# Patient Record
Sex: Male | Born: 1995 | Race: White | Hispanic: No | Marital: Single | Smoking: Current every day smoker
Health system: Southern US, Community
[De-identification: ages and names within clinical notes are randomized; demographics above are authoritative.]

---

## 2015-01-06 ENCOUNTER — Emergency Department
Admission: EM | Admit: 2015-01-06 | Discharge: 2015-01-06 | Disposition: A | Discharge status: Home / Self Care | Payer: Self-pay | Attending: Emergency Medicine | Admitting: Emergency Medicine

## 2015-01-06 ENCOUNTER — Emergency Department: Payer: Self-pay

## 2015-01-06 ENCOUNTER — Encounter: Payer: Self-pay | Admitting: Emergency Medicine

## 2015-01-06 DIAGNOSIS — J209 Acute bronchitis, unspecified: Secondary | ICD-10-CM | POA: Insufficient documentation

## 2015-01-06 DIAGNOSIS — F17210 Nicotine dependence, cigarettes, uncomplicated: Secondary | ICD-10-CM | POA: Insufficient documentation

## 2015-01-06 MED ORDER — AZITHROMYCIN 250 MG PO TABS: 500.0000 mg | ORAL_TABLET | Freq: Every day | ORAL | Status: AC

## 2015-01-06 MED ORDER — AZITHROMYCIN 250 MG PO TABS
500.0000 mg | ORAL_TABLET | Freq: Once | ORAL | Status: AC
  Administered 2015-01-06: 500 mg via ORAL
  Filled 2015-01-06: qty 2

## 2015-01-06 MED ORDER — BENZONATATE 100 MG PO CAPS
200.0000 mg | ORAL_CAPSULE | Freq: Once | ORAL | Status: AC
  Administered 2015-01-06: 200 mg via ORAL
  Filled 2015-01-06: qty 2

## 2015-01-06 MED ORDER — ALBUTEROL SULFATE HFA 108 (90 BASE) MCG/ACT IN AERS: 2.0000 | INHALATION_SPRAY | Freq: Four times a day (QID) | RESPIRATORY_TRACT | Status: DC | PRN

## 2015-01-06 MED ORDER — BENZONATATE 200 MG PO CAPS: 100.0000 mg | ORAL_CAPSULE | Freq: Three times a day (TID) | ORAL | Status: DC | PRN

## 2015-01-06 NOTE — Discharge Instructions (Signed)

## 2015-01-06 NOTE — ED Provider Notes (Signed)
[facility] Emergency Department Provider Note  ____________________________________________  Time seen: 2:30 AM  I have reviewed the triage vital signs and the nursing notes.   HISTORY  Chief Complaint Nasal Congestion     HPI Willie Valdez is a 19 y.o. male presents with nonproductive cough, chills and nasal congestion 3 days. Patient admits to cigarette use approximately half pack per day.     History reviewed. No pertinent past medical history.  There are no active problems to display for this patient.   History reviewed. No pertinent past surgical history.  Current Outpatient Rx  Name  Route  Sig  Dispense  Refill  . albuterol (PROVENTIL HFA;VENTOLIN HFA) 108 (90 BASE) MCG/ACT inhaler   Inhalation   Inhale 2 puffs into the lungs every 6 (six) hours as needed for wheezing or shortness of breath.   1 Inhaler   2   . azithromycin (ZITHROMAX) 250 MG tablet   Oral   Take 2 tablets (500 mg total) by mouth daily.   6 each   0   . benzonatate (TESSALON) 200 MG capsule   Oral   Take 1 capsule (200 mg total) by mouth 3 (three) times daily as needed for cough.   20 capsule   0     Allergies No known drug allergies No family history on file.  Social History Social History  Substance Use Topics  . Smoking status: Current Every Day Smoker -- 0.50 packs/day    Types: Cigarettes  . Smokeless tobacco: None  . Alcohol Use: No    Review of Systems  Constitutional: Negative for fever. Positive for chills Eyes: Negative for visual changes. ENT: Negative for sore throat. Cardiovascular: Negative for chest pain. Respiratory: Positive for cough Gastrointestinal: Negative for abdominal pain, vomiting and diarrhea. Genitourinary: Negative for dysuria. Musculoskeletal: Negative for back pain. Skin: Negative for rash. Neurological: Negative for headaches, focal weakness or numbness.   10-point ROS otherwise  negative.  ____________________________________________   PHYSICAL EXAM:  VITAL SIGNS: ED Triage Vitals  Enc Vitals Group     BP 01/06/15 0150 123/67 mmHg     Pulse Rate 01/06/15 0150 85     Resp 01/06/15 0150 18     Temp 01/06/15 0150 98.6 F (37 C)     Temp Source 01/06/15 0150 Oral     SpO2 01/06/15 0150 98 %     Weight 01/06/15 0150 135 lb (61.236 kg)     Height 01/06/15 0150 5\' 8"  (1.727 m)     Head Cir --      Peak Flow --      Pain Score --      Pain Loc --      Pain Edu? --      Excl. in GC? --      Constitutional: Alert and oriented. Well appearing and in no distress. Eyes: Conjunctivae are normal. PERRL. Normal extraocular movements. ENT   Head: Normocephalic and atraumatic.   Nose: No congestion/rhinnorhea.   Mouth/Throat: Mucous membranes are moist.   Neck: No stridor. Hematological/Lymphatic/Immunilogical: No cervical lymphadenopathy. Cardiovascular: Normal rate, regular rhythm. Normal and symmetric distal pulses are present in all extremities. No murmurs, rubs, or gallops. Respiratory: Normal respiratory effort without tachypnea nor retractions. Breath sounds are clear and equal bilaterally. Mild bibasilar rhonchi Gastrointestinal: Soft and nontender. No distention. There is no CVA tenderness. Genitourinary: deferred Musculoskeletal: Nontender with normal range of motion in all extremities. No joint effusions.  No lower extremity tenderness nor edema. Neurologic:  Normal speech and language. No gross focal neurologic deficits are appreciated. Speech is normal.  Skin:  Skin is warm, dry and intact. No rash noted. Psychiatric: Mood and affect are normal. Speech and behavior are normal. Patient exhibits appropriate insight and judgment.   RADIOLOGY  DG Chest 2 View (Final result) Result time: 01/06/15 03:20:30   Final result by Rad Results In Interface (01/06/15 03:20:30)   Narrative:   CLINICAL DATA: Cough, congestion, and chills since  Friday. Smoker.  EXAM: CHEST 2 VIEW  COMPARISON: None.  FINDINGS: Hyperinflation. The heart size and mediastinal contours are within normal limits. Both lungs are clear. The visualized skeletal structures are unremarkable.  IMPRESSION: No active cardiopulmonary disease.   Electronically Signed By: Burman Nieves M.D. On: 01/06/2015 03:20        INITIAL IMPRESSION / ASSESSMENT AND PLAN / ED COURSE  Pertinent labs & imaging results that were available during my care of the patient were reviewed by me and considered in my medical decision making (see chart for details).    ____________________________________________   FINAL CLINICAL IMPRESSION(S) / ED DIAGNOSES  Final diagnoses:  Acute bronchitis, unspecified organism      Darci Current, MD 01/06/15 [phone]

## 2015-01-06 NOTE — ED Notes (Signed)
Patient ambulatory to triage with steady gait, without difficulty or distress noted; pt reports sinus congestion since Friday

## 2015-01-06 NOTE — ED Notes (Signed)
Pt walked to XR.

## 2015-01-06 NOTE — ED Notes (Signed)
Pt walked from XR.

## 2015-09-27 ENCOUNTER — Encounter: Payer: Self-pay | Admitting: Emergency Medicine

## 2015-09-27 DIAGNOSIS — Y939 Activity, unspecified: Secondary | ICD-10-CM | POA: Insufficient documentation

## 2015-09-27 DIAGNOSIS — Y999 Unspecified external cause status: Secondary | ICD-10-CM | POA: Insufficient documentation

## 2015-09-27 DIAGNOSIS — W293XXA Contact with powered garden and outdoor hand tools and machinery, initial encounter: Secondary | ICD-10-CM | POA: Insufficient documentation

## 2015-09-27 DIAGNOSIS — S81811A Laceration without foreign body, right lower leg, initial encounter: Secondary | ICD-10-CM | POA: Insufficient documentation

## 2015-09-27 DIAGNOSIS — Y929 Unspecified place or not applicable: Secondary | ICD-10-CM | POA: Insufficient documentation

## 2015-09-27 DIAGNOSIS — F1290 Cannabis use, unspecified, uncomplicated: Secondary | ICD-10-CM | POA: Insufficient documentation

## 2015-09-27 DIAGNOSIS — Z23 Encounter for immunization: Secondary | ICD-10-CM | POA: Insufficient documentation

## 2015-09-27 DIAGNOSIS — F17290 Nicotine dependence, other tobacco product, uncomplicated: Secondary | ICD-10-CM | POA: Insufficient documentation

## 2015-09-27 NOTE — ED Triage Notes (Signed)
Pt says around 6pm tonight he was helping a friend cut down some trees; presents with several laceration to right lower leg from chainsaw; pt wrapped the area, no bleeding through gauze noted;

## 2015-09-28 ENCOUNTER — Telehealth: Payer: Self-pay | Admitting: Emergency Medicine

## 2015-09-28 ENCOUNTER — Emergency Department
Admission: EM | Admit: 2015-09-28 | Discharge: 2015-09-28 | Disposition: A | Discharge status: Home / Self Care | Payer: Self-pay | Attending: Emergency Medicine | Admitting: Emergency Medicine

## 2015-09-28 ENCOUNTER — Emergency Department: Payer: Self-pay

## 2015-09-28 DIAGNOSIS — S8991XA Unspecified injury of right lower leg, initial encounter: Secondary | ICD-10-CM

## 2015-09-28 DIAGNOSIS — IMO0002 Reserved for concepts with insufficient information to code with codable children: Secondary | ICD-10-CM

## 2015-09-28 MED ORDER — TETANUS-DIPHTH-ACELL PERTUSSIS 5-2.5-18.5 LF-MCG/0.5 IM SUSP
0.5000 mL | Freq: Once | INTRAMUSCULAR | Status: AC
  Administered 2015-09-28: 0.5 mL via INTRAMUSCULAR
  Filled 2015-09-28: qty 0.5

## 2015-09-28 MED ORDER — LIDOCAINE-EPINEPHRINE-TETRACAINE (LET) SOLUTION
3.0000 mL | Freq: Once | NASAL | Status: AC
  Administered 2015-09-28: 3 mL via TOPICAL
  Filled 2015-09-28: qty 3

## 2015-09-28 MED ORDER — TRAMADOL HCL 50 MG PO TABS
50.0000 mg | ORAL_TABLET | Freq: Once | ORAL | Status: AC
  Administered 2015-09-28: 50 mg via ORAL
  Filled 2015-09-28: qty 1

## 2015-09-28 MED ORDER — LIDOCAINE HCL (PF) 1 % IJ SOLN
5.0000 mL | Freq: Once | INTRAMUSCULAR | Status: DC
  Filled 2015-09-28: qty 5

## 2015-09-28 MED ORDER — TRAMADOL HCL 50 MG PO TABS: 50.0000 mg | ORAL_TABLET | Freq: Four times a day (QID) | ORAL | 0 refills | Status: DC | PRN

## 2015-09-28 NOTE — ED Notes (Signed)
Pt reports around 6 pm he was helping a friend when he noticed bleeding to his right lower leg. Pt reports he thought he had cut it on a tree branch but his friend told him he had hit it with the chainsaw. 2 small lacerations noted to right lower leg, medial side with bleeding controlled. One approx 3 to 3.5 cm and the other approx. 2.5 cm.

## 2015-09-28 NOTE — ED Provider Notes (Signed)
[facility] Emergency Department Provider Note   ____________________________________________   First MD Initiated Contact with Patient 09/28/15 0206     (approximate)  I have reviewed the triage vital signs and the nursing notes.   HISTORY  Chief Complaint Laceration    HPI Willie Valdez is a 20 y.o. male who comes into the hospital today with a cut on his leg. The patient reports that he cut his leg accidentally today with a chainsaw. He reports it occurred around 650 this afternoon. He put some peroxide on it and wrapped it and decided to come in. The patient is unsure of his last tetanus shot. He reports that it hurts and although he is able to walk it's very uncomfortable. He reports that he took some Aleve hours ago but he still uncomfortable. The patient rates his pain a 10 out of 10 in intensity. He reports that he does not think his leg is broken.He is here for repair.   History reviewed. No pertinent past medical history.  There are no active problems to display for this patient.   History reviewed. No pertinent surgical history.  Prior to Admission medications   Medication Sig Start Date End Date Taking? Authorizing Provider  albuterol (PROVENTIL HFA;VENTOLIN HFA) 108 (90 BASE) MCG/ACT inhaler Inhale 2 puffs into the lungs every 6 (six) hours as needed for wheezing or shortness of breath. 01/06/15   Darci Currentandolph N Brown, MD  benzonatate (TESSALON) 200 MG capsule Take 1 capsule (200 mg total) by mouth 3 (three) times daily as needed for cough. 01/06/15   Darci Currentandolph N Brown, MD    Allergies Review of patient's allergies indicates no known allergies.  History reviewed. No pertinent family history.  Social History Social History  Substance Use Topics  . Smoking status: Current Every Day Smoker    Types: E-cigarettes  . Smokeless tobacco: Never Used  . Alcohol use No    Review of Systems Constitutional: No fever/chills Eyes: No visual  changes. ENT: No sore throat. Cardiovascular: Denies chest pain. Respiratory: Denies shortness of breath. Gastrointestinal: No abdominal pain.  No nausea, no vomiting.  No diarrhea.  No constipation. Genitourinary: Negative for dysuria. Musculoskeletal: Negative for back pain. Skin: Right leg laceration Neurological: Negative for headaches, focal weakness or numbness.  10-point ROS otherwise negative.  ____________________________________________   PHYSICAL EXAM:  VITAL SIGNS: ED Triage Vitals  Enc Vitals Group     BP 09/27/15 2334 136/71     Pulse Rate 09/27/15 2334 81     Resp 09/27/15 2334 18     Temp 09/27/15 2334 98.4 F (36.9 C)     Temp Source 09/27/15 2334 Oral     SpO2 09/27/15 2334 100 %     Weight 09/27/15 2335 130 lb (59 kg)     Height 09/27/15 2335 5\' 7"  (1.702 m)     Head Circumference --      Peak Flow --      Pain Score 09/27/15 2335 10     Pain Loc --      Pain Edu? --      Excl. in GC? --     Constitutional: Alert and oriented. Well appearing and in Mild distress. Eyes: Conjunctivae are normal. PERRL. EOMI. Head: Atraumatic. Nose: No congestion/rhinnorhea. Mouth/Throat: Mucous membranes are moist.  Oropharynx non-erythematous. Cardiovascular: Normal rate, regular rhythm. Grossly normal heart sounds.  Good peripheral circulation. Respiratory: Normal respiratory effort.  No retractions. Lungs CTAB. Gastrointestinal: Soft and nontender. No distention. Positive bowel  sounds Musculoskeletal: No lower extremity tenderness nor edema.   Neurologic:  Normal speech and language.  Skin:  Skin is warm, dry with a laceration to his right medial lower leg approximately 2 inches followed by a more shallow 3 inch area and another deeper one and a half inch area.Marland Kitchen  Psychiatric: Mood and affect are normal. Speech and behavior are normal.  ____________________________________________   LABS (all labs ordered are listed, but only abnormal results are  displayed)  Labs Reviewed - No data to display ____________________________________________  EKG  none ____________________________________________  RADIOLOGY  Dg tib/fib right ____________________________________________   PROCEDURES  Procedure(s) performed: None  .Marland KitchenLaceration Repair Date/Time: 09/28/2015 4:14 AM Performed by: Rebecka Apley Authorized by: Rebecka Apley   Consent:    Consent obtained:  Verbal   Consent given by:  Patient   Risks discussed:  Infection, pain and poor wound healing Anesthesia (see MAR for exact dosages):    Anesthesia method:  Local infiltration and topical application   Topical anesthetic:  LET   Local anesthetic:  Lidocaine 1% w/o epi Laceration details:    Location:  Leg   Leg location:  R lower leg   Length (cm):  8 Repair type:    Repair type:  Simple Pre-procedure details:    Preparation:  Patient was prepped and draped in usual sterile fashion and imaging obtained to evaluate for foreign bodies Exploration:    Contaminated: no   Treatment:    Area cleansed with:  Saline and Shur-Clens   Amount of cleaning:  Standard   Irrigation solution:  Sterile saline   Irrigation method:  Syringe   Visualized foreign bodies/material removed: no   Skin repair:    Repair method:  Sutures   Suture size:  4-0   Suture material:  Nylon   Suture technique:  Simple interrupted   Number of sutures:  8 Approximation:    Approximation:  Close   Vermilion border: well-aligned   Post-procedure details:    Dressing:  Antibiotic ointment   Patient tolerance of procedure:  Tolerated well, no immediate complications    Critical Care performed: No  ____________________________________________   INITIAL IMPRESSION / ASSESSMENT AND PLAN / ED COURSE  Pertinent labs & imaging results that were available during my care of the patient were reviewed by me and considered in my medical decision making (see chart for details).  This is  a 20 year old male who comes into the hospital today with a laceration to his legs in a chainsaw. The patient will receive a tetanus shot. I will then put some let on the wound and send the patient for an x-ray. I will then suture the patient's wound.  Clinical Course  Value Comment By Time  DG Tibia/Fibula Right Negative Rebecka Apley, MD 08/14 0410   The patient's wound was sutured without any difficulty. He will have some bacitracin ointment placed to it and he will receive 15 dad. The patient will then be discharged home. He should have his sutures removed in 10 days. Rebecka Apley, MD 08/14 [phone]    The patient will be discharged to home. ____________________________________________   FINAL CLINICAL IMPRESSION(S) / ED DIAGNOSES  Final diagnoses:  Laceration  Leg injury, right, initial encounter      NEW MEDICATIONS STARTED DURING THIS VISIT:  New Prescriptions   No medications on file     Note:  This document was prepared using Dragon voice recognition software and may include unintentional dictation errors.  Rebecka ApleyAllison P Kaleb Linquist, MD 09/28/15 [phone]

## 2015-09-28 NOTE — Telephone Encounter (Signed)
Attempted to call patient to go over discharge instructions and inform that he can come back to pick them up--as well as work note and rx.  Pt did not answer and no voicemail set up.  Will place papers at stat desk in case pt comes back to pick them up.

## 2015-09-28 NOTE — ED Notes (Signed)
LET placed on laceration and taped into place. Suture cart to bedside per MD request. Patient resting quietly awaiting suture repair. Will continue to monitor.

## 2015-09-28 NOTE — ED Notes (Signed)
To room to discharge patient and patient not in room. Pt seen leaving by registration clerk.

## 2016-02-11 ENCOUNTER — Other Ambulatory Visit: Payer: Self-pay | Admitting: Family Medicine

## 2016-02-11 ENCOUNTER — Ambulatory Visit
Admission: RE | Admit: 2016-02-11 | Discharge: 2016-02-11 | Disposition: A | Discharge status: Home / Self Care | Payer: Medicaid Other | Source: Ambulatory Visit | Attending: Family Medicine | Admitting: Family Medicine

## 2016-02-11 DIAGNOSIS — M545 Low back pain: Secondary | ICD-10-CM

## 2017-11-27 ENCOUNTER — Encounter (HOSPITAL_COMMUNITY): Payer: Self-pay | Admitting: Behavioral Health

## 2017-11-27 ENCOUNTER — Inpatient Hospital Stay (HOSPITAL_COMMUNITY)
Admission: AD | Admit: 2017-11-27 | Discharge: 2017-11-30 | DRG: 885 | Disposition: A | Discharge status: Home / Self Care | Payer: Federal, State, Local not specified - Other | Source: Intra-hospital | Attending: Psychiatry | Admitting: Psychiatry

## 2017-11-27 ENCOUNTER — Other Ambulatory Visit: Payer: Self-pay

## 2017-11-27 DIAGNOSIS — F17290 Nicotine dependence, other tobacco product, uncomplicated: Secondary | ICD-10-CM | POA: Diagnosis present

## 2017-11-27 DIAGNOSIS — T1491XA Suicide attempt, initial encounter: Secondary | ICD-10-CM | POA: Diagnosis not present

## 2017-11-27 DIAGNOSIS — F332 Major depressive disorder, recurrent severe without psychotic features: Secondary | ICD-10-CM | POA: Diagnosis present

## 2017-11-27 DIAGNOSIS — T391X2A Poisoning by 4-Aminophenol derivatives, intentional self-harm, initial encounter: Secondary | ICD-10-CM | POA: Diagnosis not present

## 2017-11-27 DIAGNOSIS — Z818 Family history of other mental and behavioral disorders: Secondary | ICD-10-CM | POA: Diagnosis not present

## 2017-11-27 DIAGNOSIS — T391X2D Poisoning by 4-Aminophenol derivatives, intentional self-harm, subsequent encounter: Secondary | ICD-10-CM

## 2017-11-27 MED ORDER — TRAZODONE HCL 50 MG PO TABS
50.0000 mg | ORAL_TABLET | Freq: Every evening | ORAL | Status: DC | PRN
  Administered 2017-11-27 – 2017-11-29 (×3): 50 mg via ORAL
  Filled 2017-11-27: qty 7
  Filled 2017-11-27 (×3): qty 1

## 2017-11-27 MED ORDER — ALUM & MAG HYDROXIDE-SIMETH 200-200-20 MG/5ML PO SUSP: 30.0000 mL | ORAL | Status: DC | PRN

## 2017-11-27 MED ORDER — NICOTINE 21 MG/24HR TD PT24
21.0000 mg | MEDICATED_PATCH | Freq: Every day | TRANSDERMAL | Status: DC
  Administered 2017-11-27 – 2017-11-29 (×3): 21 mg via TRANSDERMAL
  Filled 2017-11-27 (×6): qty 1

## 2017-11-27 MED ORDER — HYDROXYZINE HCL 25 MG PO TABS
25.0000 mg | ORAL_TABLET | Freq: Three times a day (TID) | ORAL | Status: DC | PRN
  Administered 2017-11-27 – 2017-11-29 (×4): 25 mg via ORAL
  Filled 2017-11-27: qty 1
  Filled 2017-11-27: qty 10
  Filled 2017-11-27 (×3): qty 1

## 2017-11-27 MED ORDER — MAGNESIUM HYDROXIDE 400 MG/5ML PO SUSP: 30.0000 mL | Freq: Every day | ORAL | Status: DC | PRN

## 2017-11-27 MED ORDER — NICOTINE POLACRILEX 2 MG MT GUM
2.0000 mg | CHEWING_GUM | OROMUCOSAL | Status: DC | PRN
  Administered 2017-11-27: 2 mg via ORAL
  Filled 2017-11-27: qty 1
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
## 2017-11-27 NOTE — Progress Notes (Addendum)
Patient has 3 small girls, ages 41, 67, 24 yrs old that he misses very much.  Patient stated that his employer does not like him to miss any days.  Patient will need a letter for his employer when he is discharged.
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
## 2017-11-27 NOTE — BHH Group Notes (Signed)
Adult Psychoeducational Group Note  Date:  11/27/2017 Time:  9:40 PM  Group Topic/Focus:  Wrap-Up Group:   The focus of this group is to help patients review their daily goal of treatment and discuss progress on daily workbooks.  Participation Level:  Active  Participation Quality:  Appropriate and Attentive  Affect:  Appropriate  Cognitive:  Alert and Appropriate  Insight: Appropriate and Good  Engagement in Group:  Engaged  Modes of Intervention:  Discussion and Education  Additional Comments:  Pt attended and participated in wrap up group this evening. Pt had a good day, even though they are still getting adjusted to being here at [facility]. Pt is still still of a goal to work on while they are here.   Chrisandra Netters 11/27/2017, 9:40 PM

## 2017-11-28 DIAGNOSIS — T391X2A Poisoning by 4-Aminophenol derivatives, intentional self-harm, initial encounter: Secondary | ICD-10-CM

## 2017-11-28 DIAGNOSIS — T1491XA Suicide attempt, initial encounter: Secondary | ICD-10-CM

## 2017-11-28 NOTE — BHH Group Notes (Signed)
LCSW Group Therapy Note 11/28/2017 2:29 PM  Type of Therapy/Topic: Group Therapy: Feelings about Diagnosis  Participation Level: Active   Description of Group:  This group will allow patients to explore their thoughts and feelings about diagnoses they have received. Patients will be guided to explore their level of understanding and acceptance of these diagnoses. Facilitator will encourage patients to process their thoughts and feelings about the reactions of others to their diagnosis and will guide patients in identifying ways to discuss their diagnosis with significant others in their lives. This group will be process-oriented, with patients participating in exploration of their own experiences, giving and receiving support, and processing challenge from other group members.  Therapeutic Goals: 1. Patient will demonstrate understanding of diagnosis as evidenced by identifying two or more symptoms of the disorder 2. Patient will be able to express two feelings regarding the diagnosis 3. Patient will demonstrate their ability to communicate their needs through discussion and/or role play  Summary of Patient Progress:  Willie Valdez was engaged and participated throughout the group session. Willie Valdez reports that having a mental illness would not change who he is. Willie Valdez states that living with mental health issues is a working progress and that you will be fine if you manage it appropriately.     Therapeutic Modalities:  Cognitive Behavioral Therapy Brief Therapy Feelings Identification    Willie Valdez Willie Valdez Clinical Social Worker

## 2017-11-28 NOTE — H&P (Addendum)
Psychiatric Admission Assessment Adult  Patient Identification: Willie Valdez MRN:  [id number] Date of Evaluation:  11/28/2017 Chief Complaint:  " I was not trying to kill myself " Principal Diagnosis:  S/P Overdose , Undetermined Intent Diagnosis:   Patient Active Problem List   Diagnosis Date Noted  . 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 States " I think I took about 8 pills". (.Of note, ED note indicates that there were 84 tablets remaining in a 150 count bottle) .States he vomited" the pills up almost right away".  He reports overdose was unplanned, " spur of the moment", and in the context of conflict with GF. States " I guess I was just trying to get a rise out of her".  Patient denies recent depression, and states" I was feeling fine", states " it was just an impulsive type deal".  Associated Signs/Symptoms: Depression Symptoms:   Denies neuro-vegetative symptoms, denies anhedonia, denies sadness, denies any changes in sleep, appetite, energy level, denies suicidal ideations (Hypo) Manic Symptoms: denies  Anxiety Symptoms: denies increased anxiety symptoms prior to admission Psychotic Symptoms:  denies  PTSD Symptoms: Denies  Total Time spent with patient: 45 minutes  Past Psychiatric History: no prior psychiatric admissions . Denies prior history of suicidal attempts, denies history of self injurious behaviors or of self cutting, no history of psychosis, denies history of depression, denies history of mania, denies history of PTSD, denies history of panic or agoraphobia. Denies history of violence   Is the patient at risk to self? Yes.    Has the patient been a risk to self in the past 6 months? No.  Has the patient been a risk to self within the distant past? No.  Is the patient a risk to others? No.   Has the patient been a risk to others in the past 6 months? No.  Has the patient been a risk to others within the distant past? No.   Prior Inpatient Therapy: Prior Inpatient Therapy: (unknown) Prior Outpatient Therapy: Prior Outpatient Therapy: (unknown)  Alcohol Screening: 1. How often do you have a drink containing alcohol?: Monthly or less 2. How many drinks containing alcohol do you have on a typical day when you are drinking?: 1 or 2 3. How often do you have six or more drinks on one occasion?: Never AUDIT-C Score: 1 4. How often during the last year have you found that you were not able to stop drinking once you had started?: Never 5. How often during the last year have you failed to do what was normally expected from you becasue of drinking?: Never 6. How often during the last year have you needed a first drink in the morning to get yourself going after a heavy drinking session?: Never 7. How often during the last year have you had a feeling of guilt of remorse after drinking?: Never 8. How often during the last year have you been unable to remember what happened the night before because you had been drinking?: Never 9. Have you or someone else been injured as a result of your drinking?: No 10. Has a relative or friend or a doctor or another health worker been concerned about your drinking or suggested you cut down?: No Alcohol Use Disorder Identification Test Final Score (AUDIT): 1 Intervention/Follow-up: AUDIT Score <7 follow-up not indicated Substance Abuse History  in the last 12 months:  Denies alcohol or drug abuse  Consequences of Substance Abuse: Denies  Previous Psychotropic Medications: states he was not taking any psychiatric medications prior to admission/has never been on psychiatric medications. Psychological Evaluations:  No  Past Medical History: Denies medical illnesses . NKDA. Family History: parents alive, separated, has two sisters Family Psychiatric  History:  paternal aunt has schizophrenia, no suicides in family. Denies history of substance abuse in family . Tobacco Screening: smokes 1 PPD  Social History: 66, married, has three children ( 6,4,2), who are currently with their mother. Lives with GF. Employed . Denies legal issues . Social History   Substance and Sexual Activity  Alcohol Use No     Social History   Substance and Sexual Activity  Drug Use Yes  . Types: Marijuana   Comment: last smoked 2 weeks ago    Additional Social History: Marital status: Long term relationship Long term relationship, how long?: 4 years  What types of issues is patient dealing with in the relationship?: Financial stress Additional relationship information: No Are you sexually active?: Yes What is your sexual orientation?: Heterosexual  Has your sexual activity been affected by drugs, alcohol, medication, or emotional stress?: No Does patient have children?: Yes How many children?: 3 How is patient's relationship with their children?: Patient reports being very close to his three daughters     Pain Medications: see MAR Prescriptions: see MAR Over the Counter: see MAR History of alcohol / drug use?: Yes Longest period of sobriety (when/how long): none reported Name of Substance 1: marijuana 1 - Age of First Use: not reported 1 - Amount (size/oz): not reported 1 - Frequency: occasional use 1 - Duration: unknown 1 - Last Use / Amount: unknown  Allergies:  No Known Allergies Lab Results: No results found for this or any previous visit (from the past 48 hour(s)).  Blood Alcohol level:  No results found for: [facility]  Metabolic Disorder Labs:  No results found for: HGBA1C, MPG No results found for: PROLACTIN No results found for: CHOL, TRIG, HDL, CHOLHDL, VLDL, LDLCALC  Current Medications: Current Facility-Administered Medications  Medication Dose Route Frequency Provider Last Rate Last Dose  . alum & mag hydroxide-simeth (MAALOX/MYLANTA)  200-200-20 MG/5ML suspension 30 mL  30 mL Oral Q4H PRN Fransisca Kaufmann A, NP      . hydrOXYzine (ATARAX/VISTARIL) tablet 25 mg  25 mg Oral TID PRN Thermon Leyland, NP   25 mg at 11/28/17 0731  . magnesium hydroxide (MILK OF MAGNESIA) suspension 30 mL  30 mL Oral Daily PRN Fransisca Kaufmann A, NP      . nicotine (NICODERM CQ - dosed in mg/24 hours) patch 21 mg  21 mg Transdermal Daily Antonieta Pert, MD   21 mg at 11/28/17 0731  . traZODone (DESYREL) tablet 50 mg  50 mg Oral QHS PRN Thermon Leyland, NP   50 mg at 11/27/17 2109   PTA Medications: Medications Prior to Admission  Medication Sig Dispense Refill Last Dose  . ibuprofen (ADVIL,MOTRIN) 200 MG tablet Take 600 mg by mouth every 6 (six) hours as needed (For pain.).   11/27/2017  . OVER THE COUNTER MEDICATION Take 1 capsule by mouth 3 (three) times daily. Lavender Capsules   11/27/2017    Musculoskeletal: Strength & Muscle Tone: within normal limits Gait & Station: normal Patient leans: N/A  Psychiatric Specialty Exam: Physical Exam  Review of Systems  Constitutional: Negative.   HENT: Negative.   Eyes:  Negative.   Respiratory: Negative.   Cardiovascular: Negative.   Gastrointestinal: Negative for blood in stool, diarrhea, heartburn, nausea and vomiting.  Genitourinary: Negative.   Musculoskeletal: Negative.   Skin: Negative.   Neurological: Negative.   Endo/Heme/Allergies: Negative.   Psychiatric/Behavioral: Negative.     Blood pressure 124/86, pulse 60, temperature 98 F (36.7 C), temperature source Oral, resp. rate 20, height 5\' 5"  (1.651 m), weight 58.1 kg.Body mass index is 21.3 kg/m.  General Appearance: Well Groomed  Eye Contact:  Good  Speech:  Normal Rate  Volume:  Normal  Mood:  denies feeling depressed, states " I am OK"  Affect:  appropriate, reactive, vaguely anxious   Thought Process:  Linear and Descriptions of Associations: Intact  Orientation:  Full (Time, Place, and Person)  Thought Content:  no  hallucinations, no delusions, not internally preoccupied   Suicidal Thoughts:  No denies current suicidal or self injurious ideations, denies homicidal or violent ideations  Homicidal Thoughts:  No  Memory:  recent and remote grossly intact   Judgement:  Fair  Insight:  Fair  Psychomotor Activity:  Normal  Concentration:  Concentration: Good and Attention Span: Good  Recall:  Good  Fund of Knowledge:  Good  Language:  Good  Akathisia:  Negative  Handed:  Right  AIMS (if indicated):     Assets:  Desire for Improvement Resilience  ADL's:  Intact  Cognition:  WNL  Sleep:  Number of Hours: 6.75    Treatment Plan Summary: Daily contact with patient to assess and evaluate symptoms and progress in treatment, Medication management, Plan inpatient treatment and medications as below  Observation Level/Precautions:  15 minute checks  Laboratory:  recheck LFTs in AM  Psychotherapy:  Milieu, group therapy  Medications: patient not interested in any standing psychiatric medications at this time, states "I do not need any, I am doing fine".   Consultations:  As needed   Discharge Concerns:  -   Estimated LOS: 3-4 days   Other:     Physician Treatment Plan for Primary Diagnosis: S/P Acetaminophen overdose  Long Term Goal(s): Improvement in symptoms so as ready for discharge  Short Term Goals: Ability to identify changes in lifestyle to reduce recurrence of condition will improve and Ability to maintain clinical measurements within normal limits will improve  Physician Treatment Plan for Secondary Diagnosis: S/P Acetaminophen Overdose, undetermined intent  Long Term Goal(s): Improvement in symptoms so as ready for discharge  Short Term Goals: Ability to identify changes in lifestyle to reduce recurrence of condition will improve, Ability to verbalize feelings will improve, Ability to disclose and discuss suicidal ideas, Ability to demonstrate self-control will improve, Ability to identify and  develop effective coping behaviors will improve and Ability to maintain clinical measurements within normal limits will improve  I certify that inpatient services furnished can reasonably be expected to improve the patient's condition.    Craige Cotta, MD 10/15/20193:51 PM

## 2017-11-28 NOTE — [facility] Suicide Risk Assessment (Signed)
[facility] Admission Suicide Risk Assessment   Nursing information obtained from:  Patient Demographic factors:  Male, Adolescent or young adult, Caucasian Current Mental Status:  NA Loss Factors:  NA Historical Factors:  NA Risk Reduction Factors:  Responsible for children under 24 years of age, Positive therapeutic relationship, Positive coping skills or problem solving skills  Total Time spent with patient: 45 minutes Principal Problem: S/P Acetaminophen Overdose  Diagnosis:   Patient Active Problem List   Diagnosis Date Noted  . MDD (major depressive disorder), recurrent episode, severe (HCC) [F33.2] 11/27/2017   Subjective Data:   Continued Clinical Symptoms:  Alcohol Use Disorder Identification Test Final Score (AUDIT): 1 The "Alcohol Use Disorders Identification Test", Guidelines for Use in Primary Care, Second Edition.  World Science writer [facility]). Score between 0-7:  no or low risk or alcohol related problems. Score between 8-15:  moderate risk of alcohol related problems. Score between 16-19:  high risk of alcohol related problems. Score 20 or above:  warrants further diagnostic evaluation for alcohol dependence and treatment.   CLINICAL FACTORS:  22 year old male, lives with girlfriend, employed, reports unplanned/impulsive overdose on acetaminophen during an argument with girlfriend.  Denies depression or neurovegetative symptoms of depression, states overdose was impulsive and not related to depression but rather "I was trying to get a rise out of her".  Currently presents euthymic.    Psychiatric Specialty Exam: Physical Exam  ROS  Blood pressure 124/86, pulse 60, temperature 98 F (36.7 C), temperature source Oral, resp. rate 20, height 5\' 5"  (1.651 m), weight 58.1 kg.Body mass index is 21.3 kg/m.  See admit note MSE    COGNITIVE FEATURES THAT CONTRIBUTE TO RISK:  Closed-mindedness and Loss of executive function    SUICIDE RISK:   Moderate:  Frequent suicidal  ideation with limited intensity, and duration, some specificity in terms of plans, no associated intent, good self-control, limited dysphoria/symptomatology, some risk factors present, and identifiable protective factors, including available and accessible social support.  PLAN OF CARE:Patient will be admitted to inpatient psychiatric unit for stabilization and safety. Will provide and encourage milieu participation. Provide medication management and maked adjustments as needed.  Will follow daily.    I certify that inpatient services furnished can reasonably be expected to improve the patient's condition.   Craige Cotta, MD 11/28/2017, 4:11 PM

## 2017-11-28 NOTE — Plan of Care (Signed)
Progress Note  D: pt found in the dayroom; compliant with medication administration. Pt states he slept well. Pt rates his depression/hopelessness/anxiety a 5/5/8 out of 10 respectively. Pt complains of a headache and lower back pain that he rates at a 6/10. Pt denied any medication for these. Pt states his goal for today is control his emotions, and he will achieve this by not acting on his impulses. Pt denies any si/hi/ah/vh and verbally agrees to approach staff if these become apparent or before harming himself while at [facility]. A: pt provided support and encouragement. Pt given medication per protocol and standing orders. Q12m safety checks implemented and continued. R: pt safe on the unit. Will continue to monitor.   Pt progressing in the following metrics  Problem: Education: Goal: Knowledge of [place] [facility] Education information/materials will improve Outcome: Progressing Goal: Emotional status will improve Outcome: Progressing Goal: Mental status will improve Outcome: Progressing Goal: Verbalization of understanding the information provided will improve Outcome: Progressing   Problem: Activity: Goal: Interest or engagement in activities will improve Outcome: Progressing Goal: Sleeping patterns will improve Outcome: Progressing

## 2017-11-28 NOTE — [facility] Suicide Risk Assessment (Signed)
[facility] INPATIENT:  Family/Significant Other Suicide Prevention Education  Suicide Prevention Education:  Patient Refusal for Family/Significant Other Suicide Prevention Education: The patient Willie Valdez has refused to provide written consent for family/significant other to be provided Family/Significant Other Suicide Prevention Education during admission and/or prior to discharge.  Physician notified.  SPE completed with patient, as patient refused to consent to family contact. Patient was encouraged to share information with support network, ask questions, and talk about any concerns relating to SPE. Patient denies access to guns/firearms and verbalized understanding of information provided. Mobile Crisis information also provided to patient.    [name] 11/28/2017, 9:52 AM

## 2017-11-28 NOTE — [facility] Group Notes (Signed)
Adult Psychoeducational Group Note  Date:  11/28/2017 Time:  9:09 PM  Group Topic/Focus:  Wrap-Up Group:   The focus of this group is to help patients review their daily goal of treatment and discuss progress on daily workbooks.  Participation Level:  Active  Participation Quality:  Appropriate and Attentive  Affect:  Appropriate  Cognitive:  Alert and Appropriate  Insight: Appropriate and Good  Engagement in Group:  Engaged  Modes of Intervention:  Discussion and Education  Additional Comments:  Pt attended and participated in wrap up group this evening. Pt rated their day an 8/10, due to their girlfriend visiting them and possibly being discharged Thursday. Pt did well with their goal, which was to stay positive today.   Chrisandra Netters 11/28/2017, 9:09 PM

## 2017-11-28 NOTE — Progress Notes (Signed)
Recreation Therapy Notes  Animal-Assisted Activity (AAA) Program Checklist/Progress Notes Patient Eligibility Criteria Checklist & Daily Group note for Rec Tx Intervention  Date: 10.15.19 Time: 1430 Location: [address] Dayroom   AAA/T Program Assumption of Risk Form signed by Patient/ or Parent Legal Guardian YES   Patient is free of allergies or sever asthma YES   Patient reports no fear of animals YES  Patient reports no history of cruelty to animals YES   Patient understands his/her participation is voluntary YES   Patient washes hands before animal contact YES   Patient washes hands after animal contact  YES   Behavioral Response: Engaged  Education: Hand Washing, Appropriate Animal Interaction   Education Outcome: Acknowledges understanding/In group clarification offered/Needs additional education.   Clinical Observations/Feedback: Pt attended and participated in activity.    Willie Valdez, LRT/CTRS         Willie Valdez 11/28/2017 3:41 PM 
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
## 2017-11-29 LAB — HEPATIC FUNCTION PANEL
ALT: 13 U/L (ref 0–44)
AST: 15 U/L (ref 15–41)
Albumin: 3.9 g/dL (ref 3.5–5.0)
Alkaline Phosphatase: 43 U/L (ref 38–126)
BILIRUBIN INDIRECT: 0.6 mg/dL (ref 0.3–0.9)
Bilirubin, Direct: 0.1 mg/dL (ref 0.0–0.2)
TOTAL PROTEIN: 6.9 g/dL (ref 6.5–8.1)
Total Bilirubin: 0.7 mg/dL (ref 0.3–1.2)

## 2017-11-29 NOTE — Progress Notes (Addendum)
[facility] MD Progress Note  11/29/2017 2:07 PM Willie Valdez  MRN:  [id number] Subjective:  Patient reports " I am doing well , I feel fine". Objective : I have discussed case with treatment team and have met with patient. 22 year old male, lives with girlfriend, employed, reports unplanned/impulsive overdose on acetaminophen during an argument with girlfriend.  Denies depression or neurovegetative symptoms of depression, states overdose was impulsive and not related to depression but rather "I was trying to get a rise out of her".  Patient reports he is doing well today and denies feeling depressed, denies neuro-vegetative symptoms of depression. Regarding recent overdose, states it was impulsive, unplanned and " I was not trying to kill or hurt myself, I just wanted to get a response from her". States " I'll never do that again". Presents future oriented and states he is missing seeing his children, and needs to return to work soon.  Behavior on unit in good control. Visible in day room, pleasant on approach. Patient not currently on any standing psychiatric medication. Labs - ALT 13, AST 15.  Principal Problem: S/P Overdose  Diagnosis:   Patient Active Problem List   Diagnosis Date Noted  . MDD (major depressive disorder), recurrent episode, severe ([place]) [F33.2] 11/27/2017   Total Time spent with patient: 20 minutes  Past Psychiatric History:   Past Medical History: History reviewed. No pertinent past medical history. History reviewed. No pertinent surgical history. Family History: History reviewed. No pertinent family history. Family Psychiatric  History:  Social History:  Social History   Substance and Sexual Activity  Alcohol Use No     Social History   Substance and Sexual Activity  Drug Use Yes  . Types: Marijuana   Comment: last smoked 2 weeks ago    Social History   Socioeconomic History  . Marital status: Single    Spouse name: Not on file  . Number of children: Not  on file  . Years of education: Not on file  . Highest education level: Not on file  Occupational History  . Not on file  Social Needs  . Financial resource strain: Not on file  . Food insecurity:    Worry: Not on file    Inability: Not on file  . Transportation needs:    Medical: Not on file    Non-medical: Not on file  Tobacco Use  . Smoking status: Current Every Day Smoker    Types: E-cigarettes  . Smokeless tobacco: Never Used  Substance and Sexual Activity  . Alcohol use: No  . Drug use: Yes    Types: Marijuana    Comment: last smoked 2 weeks ago  . Sexual activity: Yes  Lifestyle  . Physical activity:    Days per week: Not on file    Minutes per session: Not on file  . Stress: Not on file  Relationships  . Social connections:    Talks on phone: Not on file    Gets together: Not on file    Attends religious service: Not on file    Active member of club or organization: Not on file    Attends meetings of clubs or organizations: Not on file    Relationship status: Not on file  Other Topics Concern  . Not on file  Social History Narrative  . Not on file   Additional Social History:    Pain Medications: see MAR Prescriptions: see MAR Over the Counter: see MAR History of alcohol / drug use?: Yes Longest period  of sobriety (when/how long): none reported Name of Substance 1: marijuana 1 - Age of First Use: not reported 1 - Amount (size/oz): not reported 1 - Frequency: occasional use 1 - Duration: unknown 1 - Last Use / Amount: unknown  Sleep: Good  Appetite:  Good  Current Medications: Current Facility-Administered Medications  Medication Dose Route Frequency Provider Last Rate Last Dose  . alum & mag hydroxide-simeth (MAALOX/MYLANTA) 200-200-20 MG/5ML suspension 30 mL  30 mL Oral Q4H PRN Elmarie Shiley A, NP      . hydrOXYzine (ATARAX/VISTARIL) tablet 25 mg  25 mg Oral TID PRN Niel Hummer, NP   25 mg at 11/28/17 2112  . magnesium hydroxide (MILK OF  MAGNESIA) suspension 30 mL  30 mL Oral Daily PRN Elmarie Shiley A, NP      . nicotine (NICODERM CQ - dosed in mg/24 hours) patch 21 mg  21 mg Transdermal Daily Sharma Covert, MD   21 mg at 11/29/17 0802  . traZODone (DESYREL) tablet 50 mg  50 mg Oral QHS PRN Niel Hummer, NP   50 mg at 11/28/17 2112    Lab Results:  Results for orders placed or performed during the hospital encounter of 11/27/17 (from the past 48 hour(s))  Hepatic function panel     Status: None   Collection Time: 11/29/17  6:40 AM  Result Value Ref Range   Total Protein 6.9 6.5 - 8.1 g/dL   Albumin 3.9 3.5 - 5.0 g/dL   AST 15 15 - 41 U/L   ALT 13 0 - 44 U/L   Alkaline Phosphatase 43 38 - 126 U/L   Total Bilirubin 0.7 0.3 - 1.2 mg/dL   Bilirubin, Direct 0.1 0.0 - 0.2 mg/dL   Indirect Bilirubin 0.6 0.3 - 0.9 mg/dL    Comment: Performed at [facility] [place] [address], [place], [place] [address]    Blood Alcohol level:  No results found for: [facility]  Metabolic Disorder Labs: No results found for: HGBA1C, MPG No results found for: PROLACTIN No results found for: CHOL, TRIG, HDL, CHOLHDL, VLDL, LDLCALC  Physical Findings: AIMS: Facial and Oral Movements Muscles of Facial Expression: None, normal Lips and Perioral Area: None, normal Jaw: None, normal Tongue: None, normal,Extremity Movements Upper (arms, wrists, hands, fingers): None, normal Lower (legs, knees, ankles, toes): None, normal, Trunk Movements Neck, shoulders, hips: None, normal, Overall Severity Severity of abnormal movements (highest score from questions above): None, normal Incapacitation due to abnormal movements: None, normal Patient's awareness of abnormal movements (rate only patient's report): No Awareness, Dental Status Current problems with teeth and/or dentures?: No Does patient usually wear dentures?: No  CIWA:  CIWA-Ar Total: 1 COWS:  COWS Total Score: 1  Musculoskeletal: Strength & Muscle Tone: within normal  limits Gait & Station: normal Patient leans: N/A  Psychiatric Specialty Exam: Physical Exam  ROS denies headache, no chest pain, no shortness of breath, no vomiting  Blood pressure 110/76, pulse 60, temperature 98.1 F (36.7 C), temperature source Oral, resp. rate 20, height '5\' 5"'  (1.651 m), weight 58.1 kg.Body mass index is 21.3 kg/m.  General Appearance: Well Groomed  Eye Contact:  Good  Speech:  Normal Rate  Volume:  Normal  Mood:  Denies depression, presents euthymic  Affect:  Appropriate and Full Range  Thought Process:  Linear and Descriptions of Associations: Intact  Orientation:  Full (Time, Place, and Person)  Thought Content:  No hallucinations, no delusions  Suicidal Thoughts:  No-denies suicidal ideations, no  self-injurious ideations, contracts for safety on unit, denies homicidal or violent ideations  Homicidal Thoughts:  No  Memory:  Recent and remote grossly intact  Judgement:  Other:  Fair/improving  Insight:  Fair/improving  Psychomotor Activity:  Normal  Concentration:  Concentration: Good and Attention Span: Good  Recall:  Good  Fund of Knowledge:  Good  Language:  Good  Akathisia:  Negative  Handed:  Right  AIMS (if indicated):     Assets:  Desire for Improvement Resilience  ADL's:  Intact  Cognition:  WNL  Sleep:  Number of Hours: 6.75   Assessment- 22 year old male, lives with girlfriend, employed, reports unplanned/impulsive overdose on acetaminophen during an argument with girlfriend.  Denies depression or neurovegetative symptoms of depression, states overdose was impulsive and not related to depression but rather "I was trying to get a rise out of her".  At this time patient denies symptoms of depression, presents euthymic, with a full range of affect, is future oriented and hoping for discharge soon in order to return to his family and to work.  Denies suicidal ideations and reports recent overdose was not related to depression or suicidal intent but  simply to get a reaction from his significant other.  Expresses regret regarding recent overdose and describes love for his family and children as protective factor .  He is not currently on any standing psychiatric medications.  Treatment Plan Summary: Daily contact with patient to assess and evaluate symptoms and progress in treatment, Medication management, Plan Inpatient treatment and Medications as below Encourage group and milieu participation to work on coping skills and symptom reduction Continue Trazodone 50 mg nightly as needed for insomnia Continue Vistaril 25 mg every 8 hours PRN for anxiety Treatment team working on disposition planning options Jenne Campus, MD 11/29/2017, 2:07 PM

## 2017-11-29 NOTE — Progress Notes (Signed)
CSW spoke with pt regarding follow up treatment.  Pt reports that he is not taking any medication and does not want any therapy services.  He declines any aftercare at this time. Garner Nash, MSW, LCSW Clinical Social Worker 11/29/2017 4:06 PM

## 2017-11-29 NOTE — Plan of Care (Signed)
D: Patient is alert, oriented, pleasant, and cooperative. Patient denies SI, HI, AVH, and verbally contracts for safety. Patient presents with anxious affect. Patient reports he is sleeping well with sleeping medication. Patient reports his appetite as good. Patient rates his anxiety 8/10. Patient presents as preoccupied stating he is "worried about work and worried about family and their safety while I'm in here". Patient denies physical symptoms/pain. Patient requested medication for anxiety and sleep.   A: PRN vistaril and trazodone administered per MD order. Support provided. Patient educated on safety on the unit and medications. Routine safety checks every 15 minutes. Patient stated understanding to tell nurse about any new physical symptoms. Patient understands to tell staff of any needs.     R: No adverse drug reactions noted. Patient verbally contracts for safety. Patient remains safe at this time and will continue to monitor.   Problem: Education: Goal: Knowledge of [place] [facility] Education information/materials will improve Outcome: Progressing   Problem: Activity: Goal: Interest or engagement in activities will improve Outcome: Progressing   Problem: Safety: Goal: Periods of time without injury will increase Outcome: Progressing  Patient oriented to the unit. Patient getting along well in the milieu. Patient remains safe and will continue to monitor.

## 2017-11-29 NOTE — Progress Notes (Signed)
Recreation Therapy Notes  Date: 10.16.19 Time: 0930 Location: 300 [address] Dayroom  Group Topic: Stress Management  Goal Area(s) Addresses:  Patient will verbalize importance of using healthy stress management.  Patient will identify positive emotions associated with healthy stress management.   Intervention: Stress Management  Activity :  Meditation.  LRT introduced the stress management technique of meditation.  LRT played Valdez meditation that dealt with resilience.  Patients were to listen and follow along as meditation played.  Education:  Stress Management, Discharge Planning.   Education Outcome: Acknowledges edcuation/In group clarification offered/Needs additional education  Clinical Observations/Feedback: Pt did not attend group.    Willie Valdez, LRT/CTRS         Willie Valdez 11/29/2017 11:34 AM 

## 2017-11-29 NOTE — Plan of Care (Signed)
Nurse discussed anxiety, depression, coping skills with patient. 

## 2017-11-29 NOTE — Progress Notes (Signed)
Pt was observed in the dayroom, seen interacting with peers. Pt attended wrap-up group. Pt denies SI/HI/AVH/Pain at this time. Pt states he hopes to be discharge tomorrow. Pt appears to be minimizing s/s. PRN vistaril and trazodone requested and given. Support and encouragement offered. Will continue with POC.

## 2017-11-29 NOTE — Progress Notes (Signed)
Adult Psychoeducational Group Note  Date:  11/29/2017 Time:  9:24 PM  Group Topic/Focus:  Wrap-Up Group:   The focus of this group is to help patients review their daily goal of treatment and discuss progress on daily workbooks.  Participation Level:  Active  Participation Quality:  Appropriate  Affect:  Appropriate  Cognitive:  Appropriate  Insight: Appropriate  Engagement in Group:  Engaged  Modes of Intervention:  Socialization and Support  Additional Comments:  Patient attended and participated in group tonight. He reports having a good day. The highlight of his day was that he is going home tomorrow.  Lita Mains [facility] 11/29/2017, 9:24 PM

## 2017-11-29 NOTE — Therapy (Signed)
Occupational Therapy Group Note  Date:  11/29/2017 Time:  3:06 PM  Group Topic/Focus:  Self Esteem Action Plan:   The focus of this group is to help patients create a plan to continue to build self-esteem after discharge.  Participation Level:  Active  Participation Quality:  Appropriate  Affect:  Appropriate  Cognitive:  Appropriate  Insight: Improving  Engagement in Group:  Engaged  Modes of Intervention:  Activity, Discussion, Education and Socialization  Additional Comments:    S: "Having an unhealthy relationship has really decreased my self esteem, as well as being with poor coworkers"  O: OT tx with focus on self esteem building this date. Education given on definition of self esteem, with both causes of low and high self esteem identified. Activity given for pt to identify a positive/aspiring trait for each letter of the alphabet. Pt to work with peers to help complete activity and build positive thinking.   A: Pt presents to group with appropriate affect, engaged and participatory throughout session. Pt contributed to discussion of self esteem for negative vs positive factors. A-z activity completed with min-mod VC's. Pt appropriately brainstorming with other peers for support. Noted improved affect after completion of activity.   P: Education given on self esteem and how to improve this date. Handouts and activities given to help facilitate skills when reintegrating into community.   Dalphine Handing, MSOT, OTR/L Behavioral Health OT/ Acute Relief OT  Dalphine Handing 11/29/2017, 3:06 PM

## 2017-11-29 NOTE — [facility] Group Notes (Signed)
[facility] [employer] Group Therapy 11/29/2017 1:15pm  Type of Therapy: Mental Health Association Presentation  Participation Level: Active  Participation Quality: Attentive  Affect: Appropriate  Cognitive: Oriented  Insight: Developing/Improving  Engagement in Therapy: Engaged  Modes of Intervention: Discussion, Education and Socialization  Summary of Progress/Problems: Mental Health Association (MHA) Speaker came to talk about his personal journey with mental health. The pt processed ways by which to relate to the speaker. MHA speaker provided handouts and educational information pertaining to groups and services offered by the MHA. Pt was engaged in speaker's presentation and was receptive to resources provided.    Subrina Vecchiarelli S Hermann Dottavio, LCSW 11/29/2017 11:04 AM  

## 2017-11-29 NOTE — Progress Notes (Signed)
D:  Patient's self inventory sheet, patient has fair sleep, sleep medication helpful.  Good appetite, normal energy level, poor concentration.  Rated depression and hopeless 5, anxiety 6.  Withdrawals, did not check any blocks.  Denied SI.  Physical problems, pain.  Physical pain, worst pain #7, lower back.  No pain medicine.  Goal is keep good attitude.  Plans to spend time with friends.  No discharge plans. A:  Medications administered per MD orders.  Emotional support and encouragement given patient. R:  Denied SI and HI, contracts for safety.  Denied A/V hallucinations.  Safety maintained with 15 minute checks.

## 2017-11-29 NOTE — Tx Team (Signed)
Interdisciplinary Treatment and Diagnostic Plan Update  11/29/2017 Time of Session: 1407 Willie Valdez MRN: [id number]  Principal Diagnosis: <principal problem not specified>  Secondary Diagnoses: Active Problems:   MDD (major depressive disorder), recurrent episode, severe (HCC)   Current Medications:  Current Facility-Administered Medications  Medication Dose Route Frequency Provider Last Rate Last Dose  . alum & mag hydroxide-simeth (MAALOX/MYLANTA) 200-200-20 MG/5ML suspension 30 mL  30 mL Oral Q4H PRN Fransisca Kaufmann A, NP      . hydrOXYzine (ATARAX/VISTARIL) tablet 25 mg  25 mg Oral TID PRN Thermon Leyland, NP   25 mg at 11/28/17 2112  . magnesium hydroxide (MILK OF MAGNESIA) suspension 30 mL  30 mL Oral Daily PRN Fransisca Kaufmann A, NP      . nicotine (NICODERM CQ - dosed in mg/24 hours) patch 21 mg  21 mg Transdermal Daily Antonieta Pert, MD   21 mg at 11/29/17 0802  . traZODone (DESYREL) tablet 50 mg  50 mg Oral QHS PRN Thermon Leyland, NP   50 mg at 11/28/17 2112   PTA Medications: Medications Prior to Admission  Medication Sig Dispense Refill Last Dose  . ibuprofen (ADVIL,MOTRIN) 200 MG tablet Take 600 mg by mouth every 6 (six) hours as needed (For pain.).   11/27/2017  . OVER THE COUNTER MEDICATION Take 1 capsule by mouth 3 (three) times daily. Lavender Capsules   11/27/2017    Patient Stressors: Marital or family conflict  Patient Strengths: Ability for insight Average or above average intelligence Capable of independent living General fund of knowledge Motivation for treatment/growth Physical Health  Treatment Modalities: Medication Management, Group therapy, Case management,  1 to 1 session with clinician, Psychoeducation, Recreational therapy.   Physician Treatment Plan for Primary Diagnosis: <principal problem not specified> Long Term Goal(s): Improvement in symptoms so as ready for discharge Improvement in symptoms so as ready for discharge   Short Term  Goals: Ability to identify changes in lifestyle to reduce recurrence of condition will improve Ability to maintain clinical measurements within normal limits will improve Ability to identify changes in lifestyle to reduce recurrence of condition will improve Ability to verbalize feelings will improve Ability to disclose and discuss suicidal ideas Ability to demonstrate self-control will improve Ability to identify and develop effective coping behaviors will improve Ability to maintain clinical measurements within normal limits will improve  Medication Management: Evaluate patient's response, side effects, and tolerance of medication regimen.  Therapeutic Interventions: 1 to 1 sessions, Unit Group sessions and Medication administration.  Evaluation of Outcomes: Progressing  Physician Treatment Plan for Secondary Diagnosis: Active Problems:   MDD (major depressive disorder), recurrent episode, severe (HCC)  Long Term Goal(s): Improvement in symptoms so as ready for discharge Improvement in symptoms so as ready for discharge   Short Term Goals: Ability to identify changes in lifestyle to reduce recurrence of condition will improve Ability to maintain clinical measurements within normal limits will improve Ability to identify changes in lifestyle to reduce recurrence of condition will improve Ability to verbalize feelings will improve Ability to disclose and discuss suicidal ideas Ability to demonstrate self-control will improve Ability to identify and develop effective coping behaviors will improve Ability to maintain clinical measurements within normal limits will improve     Medication Management: Evaluate patient's response, side effects, and tolerance of medication regimen.  Therapeutic Interventions: 1 to 1 sessions, Unit Group sessions and Medication administration.  Evaluation of Outcomes: Progressing   RN Treatment Plan for Primary Diagnosis: <principal problem not  specified> Long Term Goal(s): Knowledge of disease and therapeutic regimen to maintain health will improve  Short Term Goals: Ability to identify and develop effective coping behaviors will improve and Compliance with prescribed medications will improve  Medication Management: RN will administer medications as ordered by provider, will assess and evaluate patient's response and provide education to patient for prescribed medication. RN will report any adverse and/or side effects to prescribing provider.  Therapeutic Interventions: 1 on 1 counseling sessions, Psychoeducation, Medication administration, Evaluate responses to treatment, Monitor vital signs and CBGs as ordered, Perform/monitor CIWA, COWS, AIMS and Fall Risk screenings as ordered, Perform wound care treatments as ordered.  Evaluation of Outcomes: Progressing   LCSW Treatment Plan for Primary Diagnosis: <principal problem not specified> Long Term Goal(s): Safe transition to appropriate next level of care at discharge, Engage patient in therapeutic group addressing interpersonal concerns.  Short Term Goals: Engage patient in aftercare planning with referrals and resources, Increase social support and Increase skills for wellness and recovery  Therapeutic Interventions: Assess for all discharge needs, 1 to 1 time with Social worker, Explore available resources and support systems, Assess for adequacy in community support network, Educate family and significant other(s) on suicide prevention, Complete Psychosocial Assessment, Interpersonal group therapy.  Evaluation of Outcomes: Progressing   Progress in Treatment: Attending groups: Yes. Participating in groups: Yes. Taking medication as prescribed: Yes. Toleration medication: Yes. Family/Significant other contact made: No, will contact:  pt declined consent Patient understands diagnosis: Yes. Discussing patient identified problems/goals with staff: Yes. Medical problems  stabilized or resolved: Yes. Denies suicidal/homicidal ideation: Yes. Issues/concerns per patient self-inventory: No. Other: none  New problem(s) identified: No, Describe:  none  New Short Term/Long Term Goal(s):  Patient Goals:  "control my anger"  Discharge Plan or Barriers:   Reason for Continuation of Hospitalization: Depression  Estimated Length of Stay: 1-2 days.  Attendees: Patient:Willie Valdez 11/29/2017   Physician: Javier Docker, MD 11/29/2017   Nursing: Quintella Reichert, RN 11/29/2017   RN Care Manager: 11/29/2017   Social Worker: Daleen Squibb, LCSW 11/29/2017   Recreational Therapist:  11/29/2017   Other:  11/29/2017   Other:  11/29/2017   Other: 11/29/2017        Scribe for Treatment Team: Lorri Frederick, LCSW 11/29/2017 3:41 PM

## 2017-11-30 DIAGNOSIS — T391X2A Poisoning by 4-Aminophenol derivatives, intentional self-harm, initial encounter: Secondary | ICD-10-CM

## 2017-11-30 MED ORDER — TRAZODONE HCL 50 MG PO TABS: 50.0000 mg | ORAL_TABLET | Freq: Every evening | ORAL | 0 refills | Status: AC | PRN

## 2017-11-30 MED ORDER — HYDROXYZINE HCL 25 MG PO TABS: 25.0000 mg | ORAL_TABLET | Freq: Three times a day (TID) | ORAL | 0 refills | Status: AC | PRN

## 2017-11-30 NOTE — Progress Notes (Signed)
Patient ID: Willie Valdez, male   DOB: [birth year], 22 y.o.   MRN: [id number]  Discharge Note  D) Patient discharged to lobby. Patient states readiness for discharge. Patient denies SI/HI, AVH and is not delusional or psychotic. Patient in no acute distress. Patient has completed their Suicide Safety Plan. Patient provided an opportunity to complete and return Patient Satisfaction Survey.   A) Written and verbal discharge instructions given to the patient. Patient accepting to information and verbalized understanding. Patient agrees to the discharge plan. Opportunity for questions and concerns presented to patient. Patient denied any further questions or concerns. All belongings returned to patient. Patient signed for return of belongings and discharge paperwork. Patient provided a copy of their Suicide Safety Plan and has been provided a copy of the Patient Satisfaction Survey with return instructions.  R) Patient safely escorted to the lobby. Patient discharged from [facility] with medication samples, prescriptions, personal belongings, follow-up appointment in place and discharge paperwork.
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
## 2017-11-30 NOTE — Progress Notes (Signed)
  [facility] Adult Case Management Discharge Plan :  Will you be returning to the same living situation after discharge:  Yes,  with girlfriend At discharge, do you have transportation home?: Yes,  girlfriend Do you have the ability to pay for your medications: No. No medication prescribed.  Release of information consent forms completed and in the chart;  Patient's signature needed at discharge.  Patient to Follow up at: Follow-up Information    Patient has declined any follow up services. Follow up.           Next level of care provider has access to [facility] Link:no  Safety Planning and Suicide Prevention discussed: No. Pt declined consent. SPE completed with pt.  Have you used any form of tobacco in the last 30 days? (Cigarettes, Smokeless Tobacco, Cigars, and/or Pipes): Yes  Has patient been referred to the Quitline?: Patient refused referral  Patient has been referred for addiction treatment: Pt. refused referral  Lorri Frederick, LCSW 11/30/2017, 9:32 AM

## 2017-11-30 NOTE — [facility] Group Notes (Signed)
[facility] LCSW Group Therapy Note  Date/Time: 11/30/17, 1315  Type of Therapy/Topic:  Group Therapy:  Balance in Life  Participation Level:  active  Description of Group:    This group will address the concept of balance and how it feels and looks when one is unbalanced. Patients will be encouraged to process areas in their lives that are out of balance, and identify reasons for remaining unbalanced. Facilitators will guide patients utilizing problem- solving interventions to address and correct the stressor making their life unbalanced. Understanding and applying boundaries will be explored and addressed for obtaining  and maintaining a balanced life. Patients will be encouraged to explore ways to assertively make their unbalanced needs known to significant others in their lives, using other group members and facilitator for support and feedback.  Therapeutic Goals: 1. Patient will identify two or more emotions or situations they have that consume much of in their lives. 2. Patient will identify signs/triggers that life has become out of balance:  3. Patient will identify two ways to set boundaries in order to achieve balance in their lives:  4. Patient will demonstrate ability to communicate their needs through discussion and/or role plays  Summary of Patient Progress: Pt shared that financial problems are consuming much of his life.  Pt very active during group discussion about setting boundaries to help keep life more in balance. Good participation and engagement.           Therapeutic Modalities:   Cognitive Behavioral Therapy Solution-Focused Therapy Assertiveness Training  [place], [place]

## 2017-11-30 NOTE — [facility] Suicide Risk Assessment (Signed)
[facility] Discharge Suicide Risk Assessment   Principal Problem: S/P overdose  Discharge Diagnoses:  Patient Active Problem List   Diagnosis Date Noted  . MDD (major depressive disorder), recurrent episode, severe (HCC) [F33.2] 11/27/2017    Total Time spent with patient: 30 minutes  Musculoskeletal: Strength & Muscle Tone: within normal limits Gait & Station: normal Patient leans: N/A  Psychiatric Specialty Exam: ROS no headache, no chest pain, no shortness of breath, no vomiting, no diarrhea, no fever or chills   Blood pressure 120/89, pulse 63, temperature 98.3 F (36.8 C), temperature source Oral, resp. rate 18, height 5\' 5"  (1.651 m), weight 58.1 kg.Body mass index is 21.3 kg/m.  General Appearance: Well Groomed  Eye Contact::  Good  Speech:  Normal Rate409  Volume:  Normal  Mood:  reports " my mood is good", denies depression  Affect:  Appropriate and Full Range  Thought Process:  Linear and Descriptions of Associations: Intact  Orientation:  Full (Time, Place, and Person)  Thought Content:  no hallucinations, no delusions, not internally preoccupied   Suicidal Thoughts:  No denies any suicidal or self injurious ideations, denies any homicidal or violent ideations  Homicidal Thoughts:  No  Memory:  recent and remote grossly intact   Judgement:  Other:  improving   Insight:  improving   Psychomotor Activity:  Normal  Concentration:  Good  Recall:  Good  Fund of Knowledge:Good  Language: Good  Akathisia:  Negative  Handed:  Right  AIMS (if indicated):     Assets:  Communication Skills Desire for Improvement Physical Health Resilience  Sleep:  Number of Hours: 6.75  Cognition: WNL  ADL's:  Intact   Mental Status Per Nursing Assessment::   On Admission:  NA  Demographic Factors:  22, separated, lives with GF, has three children, employed   Loss Factors: Financial stressors, argument with GF  Historical Factors: No prior psychiatric admissions, no history of  prior suicide attempts, denies history of severe/significant  depressive episodes   Risk Reduction Factors:   Responsible for children under 15 years of age, Sense of responsibility to family, Employed, Living with another person, especially a relative and Positive coping skills or problem solving skills  Continued Clinical Symptoms:  At this time patient presents well groomed, calm, mood is "OK", denies feeling depressed, characterizes mood as 8/10 with 10 being best, no thought disorder, no suicidal or self injurious ideations, no homicidal or violent ideations, no hallucinations, no delusions, not internally preoccupied, future oriented. Behavior on unit calm and in good control. He is not currently on any standing psychiatric medications.  Cognitive Features That Contribute To Risk:  No gross cognitive deficits noted upon discharge. Is alert , attentive, and oriented x 3   Suicide Risk:  Mild:  Suicidal ideation of limited frequency, intensity, duration, and specificity.  There are no identifiable plans, no associated intent, mild dysphoria and related symptoms, good self-control (both objective and subjective assessment), few other risk factors, and identifiable protective factors, including available and accessible social support.  Follow-up Information    Patient has declined any follow up services. Follow up.           Plan Of Care/Follow-up recommendations:  Activity:  as tolerated  Diet:  regular Tests:  NA Other:  See below  Patient expresses readiness for discharge , no current grounds for involuntary commitment Leaving unit in good spirits, plans to return home States GF will be picking him up later today. Expresses interest in outpatient therapy-  CSW reviewing discharge options .   Craige Cotta, MD 11/30/2017, 11:23 AM

## 2017-11-30 NOTE — Progress Notes (Signed)
Dr Jama Flavors called and said that pt had agreed to attend therapy for follow up services.  CSW spoke with pt and pt again declined any services, as he had done twice before. Garner Nash, MSW, LCSW Clinical Social Worker 11/30/2017 2:48 PM

## 2017-11-30 NOTE — Progress Notes (Signed)
Patient ID: Willie Valdez, male   DOB: 1995/07/10, 22 y.o.   MRN: [id number]  Nursing Progress Note [id number]  Data: Patient presents with calm, pleasant mood. Patient denies need for any medications this morning. On initial approach, patient is resting in bed. Patient reports he has been going to groups but is planning to "relax until I leave". Patient completed self-inventory sheet and rates depression, hopelessness, and anxiety 0,0,8 respectively. Patient rates their sleep and appetite as poor/fair respectively. Patient states goal for today is to "go home".  Patient currently denies SI/HI/AVH.   Action: Patient is educated about and provided medication per provider's orders. Patient safety maintained with q15 min safety checks and frequent rounding. Low fall risk precautions in place. Emotional support given. 1:1 interaction and active listening provided. Patient encouraged to attend meals, groups, and work on treatment plan and goals. Labs, vital signs and patient behavior monitored throughout shift.   Response: Patient remains safe on the unit at this time and agrees to come to staff with any issues/concerns. Patient is interacting with peers appropriately on the unit. Will continue to support and monitor.

## 2018-03-06 IMAGING — CR DG LUMBAR SPINE COMPLETE 4+V
5 series · 5 of 5 positions shown · non-contrast
Comparison: None.

CLINICAL DATA: Patient c/o low back pain off and on for years, no
injury; non radiating

EXAM:
LUMBAR SPINE - COMPLETE 4+ VIEW

[t l-spine a.p.]
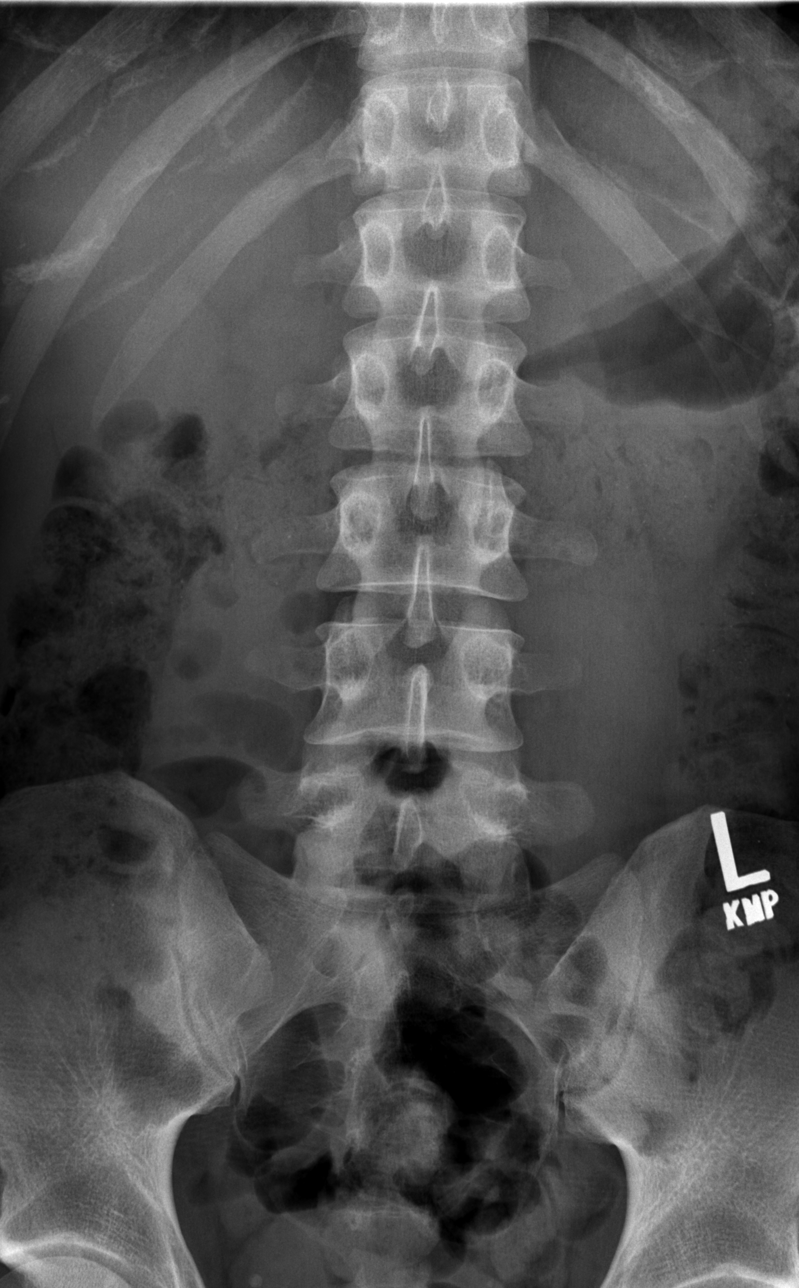

[t l-spine oblique exposure (1 of 2)]
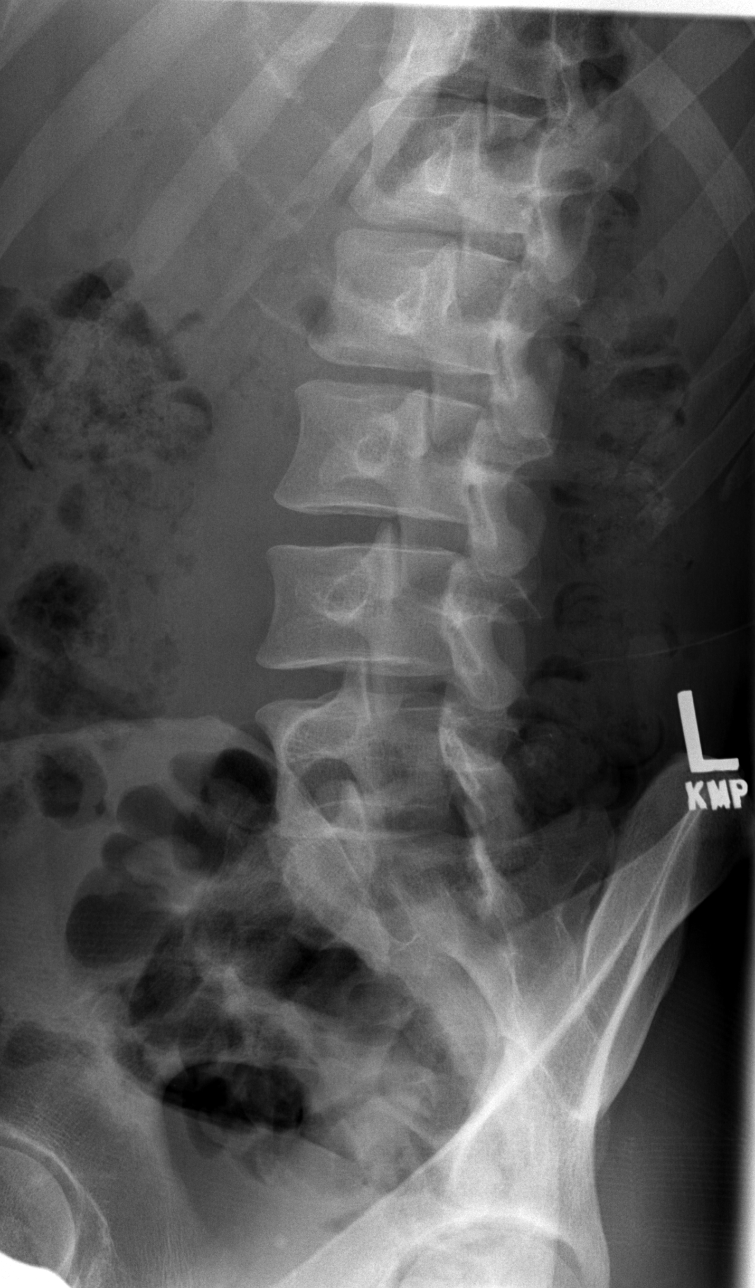

[t l-spine oblique exposure (2 of 2)]
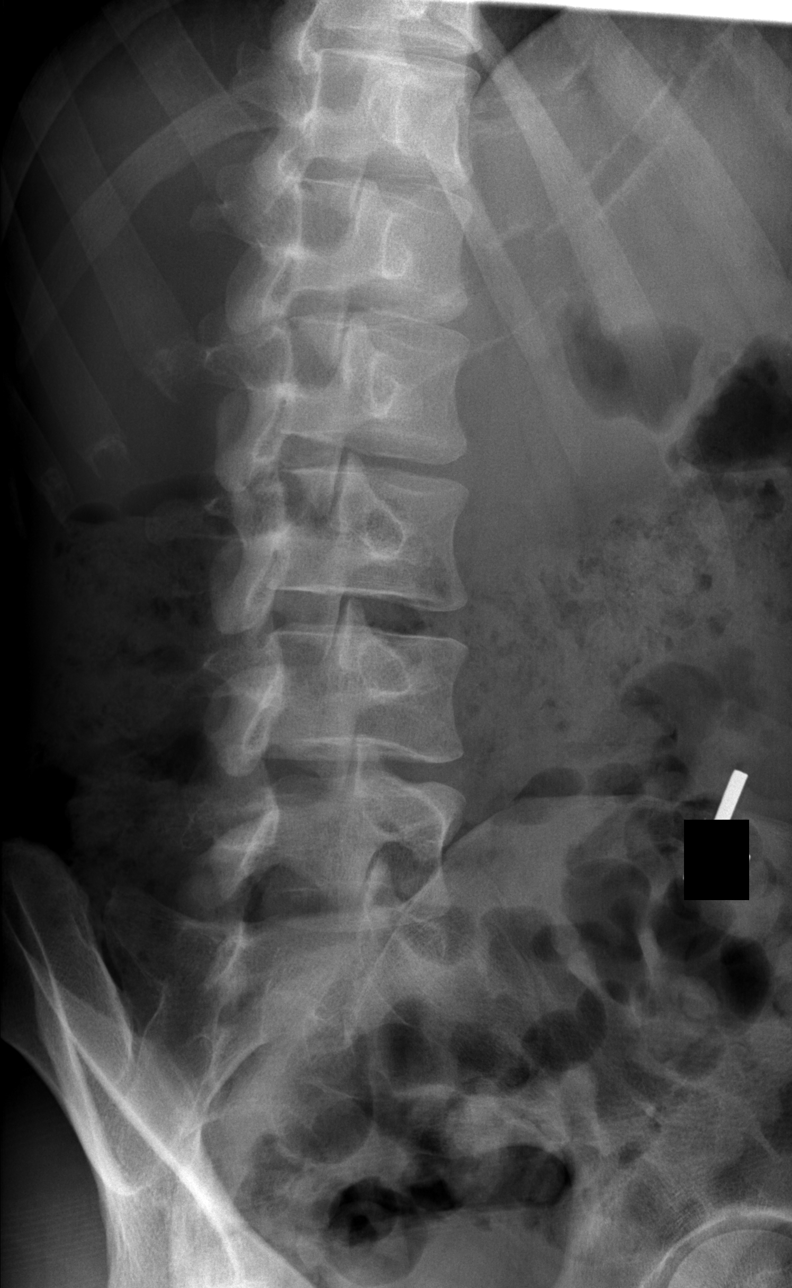

[t l-spine lat]
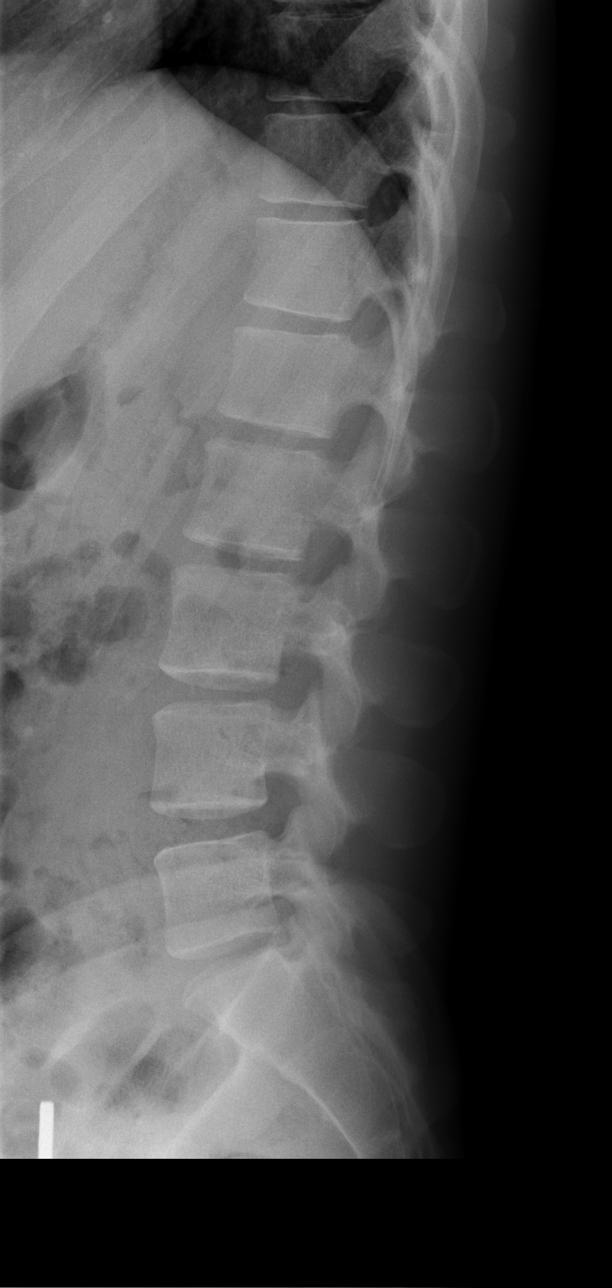

[t l-spine l5-s1 spot]
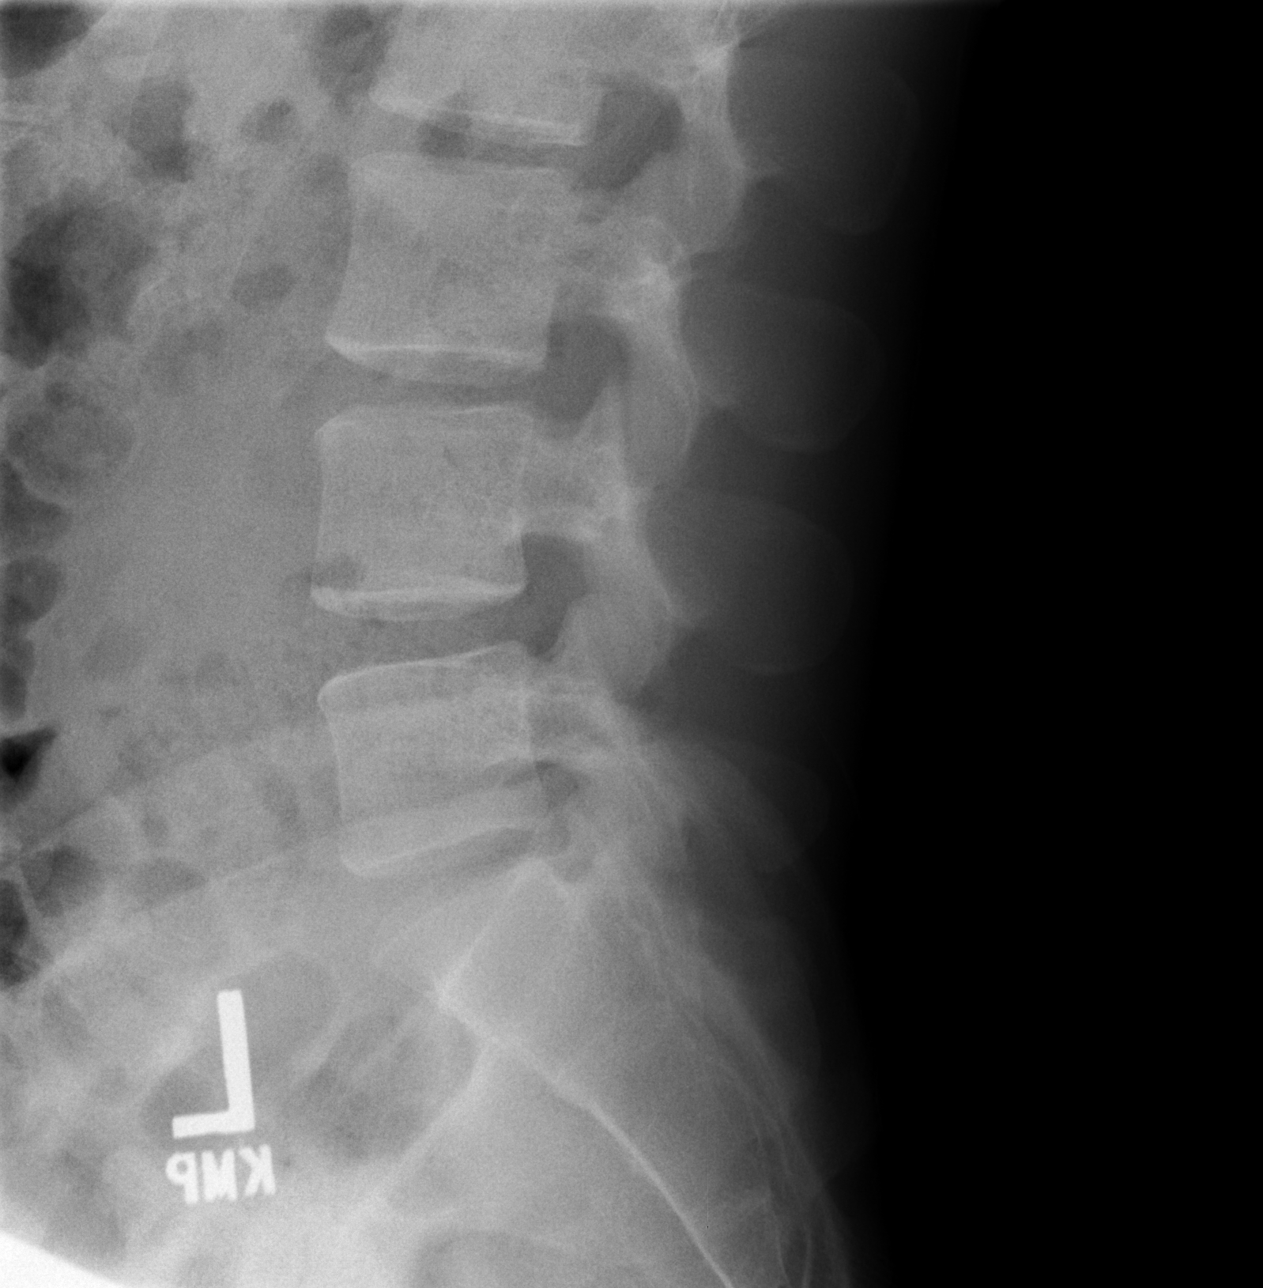

[5 of 5 positions shown; findings below may reference images not displayed]

FINDINGS: There is no evidence of lumbar spine fracture. Alignment is normal.
Intervertebral disc spaces are maintained.
IMPRESSION: Negative.
